# Patient Record
Sex: Female | Born: 1995 | Race: White | Hispanic: No | Marital: Single
Health system: Southern US, Community
[De-identification: ages and names within clinical notes are randomized; demographics above are authoritative.]

---
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
## 2019-06-11 ENCOUNTER — Ambulatory Visit: Payer: Self-pay | Attending: Internal Medicine

## 2019-06-11 ENCOUNTER — Other Ambulatory Visit: Payer: Self-pay

## 2019-06-11 DIAGNOSIS — Z20822 Contact with and (suspected) exposure to covid-19: Secondary | ICD-10-CM

## 2019-06-11 DIAGNOSIS — U071 COVID-19: Secondary | ICD-10-CM | POA: Insufficient documentation

## 2019-06-12 ENCOUNTER — Telehealth: Payer: Self-pay

## 2019-06-12 NOTE — Telephone Encounter (Signed)
Pt. Checking on COVID 19 results, not available.

## 2019-06-13 ENCOUNTER — Telehealth: Payer: Self-pay

## 2019-06-13 LAB — NOVEL CORONAVIRUS, NAA: SARS-CoV-2, NAA: DETECTED — AB

## 2019-06-13 NOTE — Telephone Encounter (Signed)
Patient called for her COVID-19 lab result.  She was told that her test 06/11/19 was still active and had not resulted.  She will call later.

## 2021-12-21 ENCOUNTER — Other Ambulatory Visit: Payer: Self-pay | Admitting: Family Medicine

## 2021-12-21 DIAGNOSIS — E049 Nontoxic goiter, unspecified: Secondary | ICD-10-CM

## 2021-12-24 ENCOUNTER — Other Ambulatory Visit: Payer: Self-pay

## 2022-01-07 ENCOUNTER — Ambulatory Visit
Admission: RE | Admit: 2022-01-07 | Discharge: 2022-01-07 | Disposition: A | Discharge status: Home / Self Care | Payer: BC Managed Care – PPO | Source: Ambulatory Visit | Attending: Family Medicine | Admitting: Family Medicine

## 2022-01-07 DIAGNOSIS — E049 Nontoxic goiter, unspecified: Secondary | ICD-10-CM
